# Patient Record
Sex: Male | Born: 1985 | Race: White | Hispanic: No | Marital: Single | Smoking: Never smoker
Health system: Southern US, Community
[De-identification: ages and names within clinical notes are randomized; demographics above are authoritative.]

## PROBLEM LIST (undated history)

## (undated) DIAGNOSIS — Q600 Renal agenesis, unilateral: Secondary | ICD-10-CM

## (undated) DIAGNOSIS — Z789 Other specified health status: Secondary | ICD-10-CM

## (undated) HISTORY — PX: HAND SURGERY: SHX662

---

## 2015-09-09 ENCOUNTER — Other Ambulatory Visit: Payer: Self-pay | Admitting: Nephrology

## 2015-09-09 DIAGNOSIS — L409 Psoriasis, unspecified: Secondary | ICD-10-CM

## 2015-09-09 DIAGNOSIS — R809 Proteinuria, unspecified: Secondary | ICD-10-CM

## 2015-09-09 DIAGNOSIS — N182 Chronic kidney disease, stage 2 (mild): Secondary | ICD-10-CM

## 2015-09-15 ENCOUNTER — Ambulatory Visit
Admission: RE | Admit: 2015-09-15 | Discharge: 2015-09-15 | Disposition: A | Discharge status: Home / Self Care | Payer: BLUE CROSS/BLUE SHIELD | Source: Ambulatory Visit | Attending: Nephrology | Admitting: Nephrology

## 2015-09-15 DIAGNOSIS — R809 Proteinuria, unspecified: Secondary | ICD-10-CM

## 2015-09-15 DIAGNOSIS — N182 Chronic kidney disease, stage 2 (mild): Secondary | ICD-10-CM

## 2015-09-15 DIAGNOSIS — L409 Psoriasis, unspecified: Secondary | ICD-10-CM

## 2015-10-03 ENCOUNTER — Other Ambulatory Visit (HOSPITAL_COMMUNITY): Payer: Self-pay | Admitting: Nephrology

## 2015-10-03 DIAGNOSIS — R809 Proteinuria, unspecified: Secondary | ICD-10-CM

## 2015-10-17 ENCOUNTER — Other Ambulatory Visit: Payer: Self-pay | Admitting: Radiology

## 2015-10-20 ENCOUNTER — Encounter (HOSPITAL_COMMUNITY): Payer: Self-pay

## 2015-10-20 ENCOUNTER — Ambulatory Visit (HOSPITAL_COMMUNITY)
Admission: RE | Admit: 2015-10-20 | Discharge: 2015-10-20 | Disposition: A | Discharge status: Home / Self Care | Payer: No Typology Code available for payment source | Source: Ambulatory Visit | Attending: Nephrology | Admitting: Nephrology

## 2015-10-20 DIAGNOSIS — N051 Unspecified nephritic syndrome with focal and segmental glomerular lesions: Secondary | ICD-10-CM | POA: Insufficient documentation

## 2015-10-20 DIAGNOSIS — R809 Proteinuria, unspecified: Secondary | ICD-10-CM | POA: Insufficient documentation

## 2015-10-20 DIAGNOSIS — N028 Recurrent and persistent hematuria with other morphologic changes: Secondary | ICD-10-CM | POA: Insufficient documentation

## 2015-10-20 HISTORY — DX: Other specified health status: Z78.9

## 2015-10-20 LAB — CBC
HCT: 43.7 % (ref 39.0–52.0)
HEMOGLOBIN: 14.9 g/dL (ref 13.0–17.0)
MCH: 29 pg (ref 26.0–34.0)
MCHC: 34.1 g/dL (ref 30.0–36.0)
MCV: 85.2 fL (ref 78.0–100.0)
PLATELETS: 194 10*3/uL (ref 150–400)
RBC: 5.13 MIL/uL (ref 4.22–5.81)
RDW: 12.8 % (ref 11.5–15.5)
WBC: 5.9 10*3/uL (ref 4.0–10.5)

## 2015-10-20 LAB — APTT: aPTT: 31 seconds (ref 24–36)

## 2015-10-20 LAB — PROTIME-INR
INR: 0.95
Prothrombin Time: 12.7 seconds (ref 11.4–15.2)

## 2015-10-20 MED ORDER — FENTANYL CITRATE (PF) 100 MCG/2ML IJ SOLN
INTRAMUSCULAR | Status: AC | PRN
  Administered 2015-10-20: 50 ug via INTRAVENOUS
  Administered 2015-10-20: 25 ug via INTRAVENOUS

## 2015-10-20 MED ORDER — MIDAZOLAM HCL 2 MG/2ML IJ SOLN
INTRAMUSCULAR | Status: AC | PRN
  Administered 2015-10-20: 1 mg via INTRAVENOUS
  Administered 2015-10-20: 0.5 mg via INTRAVENOUS

## 2015-10-20 MED ORDER — MIDAZOLAM HCL 2 MG/2ML IJ SOLN
INTRAMUSCULAR | Status: AC
Start: 2015-10-20 — End: 2015-10-20
  Filled 2015-10-20: qty 2

## 2015-10-20 MED ORDER — HYDRALAZINE HCL 20 MG/ML IJ SOLN: 10.0000 mg | Freq: Once | INTRAMUSCULAR | Status: DC

## 2015-10-20 MED ORDER — SODIUM CHLORIDE 0.9 % IV SOLN
INTRAVENOUS | Status: AC | PRN
  Administered 2015-10-20: 10 mL/h via INTRAVENOUS

## 2015-10-20 MED ORDER — SODIUM CHLORIDE 0.9 % IV SOLN
INTRAVENOUS | Status: DC
Start: 2015-10-20 — End: 2015-10-21

## 2015-10-20 MED ORDER — FENTANYL CITRATE (PF) 100 MCG/2ML IJ SOLN
INTRAMUSCULAR | Status: AC
  Filled 2015-10-20: qty 2

## 2015-10-20 MED ORDER — LIDOCAINE HCL 1 % IJ SOLN
INTRAMUSCULAR | Status: AC
  Filled 2015-10-20: qty 20

## 2015-10-20 NOTE — Discharge Instructions (Signed)
Kidney Biopsy, Care After °Refer to this sheet in the next few weeks. These instructions provide you with information on caring for yourself after your procedure. Your health care provider may also give you more specific instructions. Your treatment has been planned according to current medical practices, but problems sometimes occur. Call your health care provider if you have any problems or questions after your procedure.  °WHAT TO EXPECT AFTER THE PROCEDURE  °· You may notice blood in the urine for the first 24 hours after the biopsy. °· You may feel some pain at the biopsy site for 1-2 weeks after the biopsy. °HOME CARE INSTRUCTIONS °· Do not lift anything heavier than 10 lb (4.5 kg) for 2 weeks. °· Do not take any non-steroidal anti-inflammatory drugs (NSAIDs) or any blood thinners for a week after the biopsy unless instructed to do so by your health care provider. °· Only take medicines for pain, fever, or discomfort as directed by your health care provider. °SEEK MEDICAL CARE IF: °· You have bloody urine more than 24 hours after the biopsy.   °· You develop a fever.   °· You cannot urinate.   °· You have increasing pain at the biopsy site.   °SEEK IMMEDIATE MEDICAL CARE IF: °You feel faint or dizzy.  °  °This information is not intended to replace advice given to you by your health care provider. Make sure you discuss any questions you have with your health care provider. °  °Document Released: 09/20/2012 Document Reviewed: 09/20/2012 °Elsevier Interactive Patient Education ©2016 Elsevier Inc. ° °

## 2015-10-20 NOTE — H&P (Signed)
Chief Complaint: proteinuria  Referring Physician:Dr. Zetta Bills  Supervising Physician: Irish Lack  Patient Status:  Out-pt  HPI: Bradley Shannon is an 30 y.o. male with a history of psoriasis for which he receives an injection one every 3 months.  During his routine lab work, he was noted to have an elevated creatinine.  Upon further work up, he was noted to have protein in his urine.  He was referred to a nephrologist who has placed him on a protein restricted and caffeine restricted diet.  A request for a random renal biopsy has been made.  He presents today for this procedure.  Past Medical History:  Past Medical History:  Diagnosis Date  . Medical history non-contributory     Past Surgical History:  Past Surgical History:  Procedure Laterality Date  . HAND SURGERY      Family History:  Family History  Problem Relation Age of Onset  . Breast cancer Mother     Social History:  reports that he has never smoked. He has never used smokeless tobacco. He reports that he drinks alcohol. He reports that he does not use drugs.  Allergies: No Known Allergies  Medications: Medications reviewed in Epic  Please HPI for pertinent positives, otherwise complete 10 system ROS negative.  Mallampati Score: MD Evaluation Airway: WNL Heart: WNL Abdomen: WNL Chest/ Lungs: WNL ASA  Classification: 1 Mallampati/Airway Score: Two  Physical Exam: BP (!) 141/88   Pulse (!) 59   Temp 98.1 F (36.7 C) (Oral)   Resp 18   Ht 5\' 11"  (1.803 m)   Wt 170 lb (77.1 kg)   SpO2 100%   BMI 23.71 kg/m  Body mass index is 23.71 kg/m. General: pleasant, WD, WN white male who is laying in bed in NAD HEENT: head is normocephalic, atraumatic.  Sclera are noninjected.  PERRL.  Ears and nose without any masses or lesions.  Mouth is pink and moist Heart: regular, rate, and rhythm.  Normal s1,s2. No obvious murmurs, gallops, or rubs noted.  Palpable radial and pedal pulses  bilaterally Lungs: CTAB, no wheezes, rhonchi, or rales noted.  Respiratory effort nonlabored Abd: soft, NT, ND, +BS, no masses, hernias, or organomegaly Psych: A&Ox3 with an appropriate affect.   Labs: Results for orders placed or performed during the hospital encounter of 10/20/15 (from the past 48 hour(s))  APTT upon arrival     Status: None   Collection Time: 10/20/15  6:06 AM  Result Value Ref Range   aPTT 31 24 - 36 seconds  CBC upon arrival     Status: None   Collection Time: 10/20/15  6:06 AM  Result Value Ref Range   WBC 5.9 4.0 - 10.5 K/uL   RBC 5.13 4.22 - 5.81 MIL/uL   Hemoglobin 14.9 13.0 - 17.0 g/dL   HCT 21.3 08.6 - 57.8 %   MCV 85.2 78.0 - 100.0 fL   MCH 29.0 26.0 - 34.0 pg   MCHC 34.1 30.0 - 36.0 g/dL   RDW 46.9 62.9 - 52.8 %   Platelets 194 150 - 400 K/uL  Protime-INR upon arrival     Status: None   Collection Time: 10/20/15  6:06 AM  Result Value Ref Range   Prothrombin Time 12.7 11.4 - 15.2 seconds   INR 0.95     Imaging: No results found.  Assessment/Plan 1. Proteinuria -we will plan to proceed today with a random renal biopsy -his latest BP was 137/76 which is an improvement from  arrival which was likely elevated due to nerves. -other labs and vitals have been reviewed -Risks and Benefits discussed with the patient including, but not limited to bleeding, infection, damage to adjacent structures or low yield requiring additional tests. All of the patient's questions were answered, patient is agreeable to proceed. Consent signed and in chart.  Thank you for this interesting consult.  I greatly enjoyed meeting Bradley Shannon and look forward to participating in their care.  A copy of this report was sent to the requesting provider on this date.  Electronically Signed: Letha CapeSBORNE,Lilian Fuhs E 10/20/2015, 8:16 AM   I spent a total of  30 Minutes   in face to face in clinical consultation, greater than 50% of which was counseling/coordinating care for  proteinuria

## 2015-10-20 NOTE — Procedures (Signed)
Interventional Radiology Procedure Note  Procedure:  US guided core biopsy of right kidney  Complications: None   Estimated Blood Loss: < 10 mL  16 G core samples x 2 of right lower pole renal cortex.  Plan:  Bedrest/recovery x 3 hours  Bradley Shannon, M.D Pager:  [phone]

## 2015-10-27 ENCOUNTER — Encounter (HOSPITAL_COMMUNITY): Payer: Self-pay

## 2018-01-28 IMAGING — US US BIOPSY
1 series · 12 of 12 positions shown · non-contrast
Comparison: none

CLINICAL DATA: Proteinuria and need for renal biopsy.

[Series 1: us biopsy · 0.20mm/px · 12 of 12 slices shown]
[im 1/12]
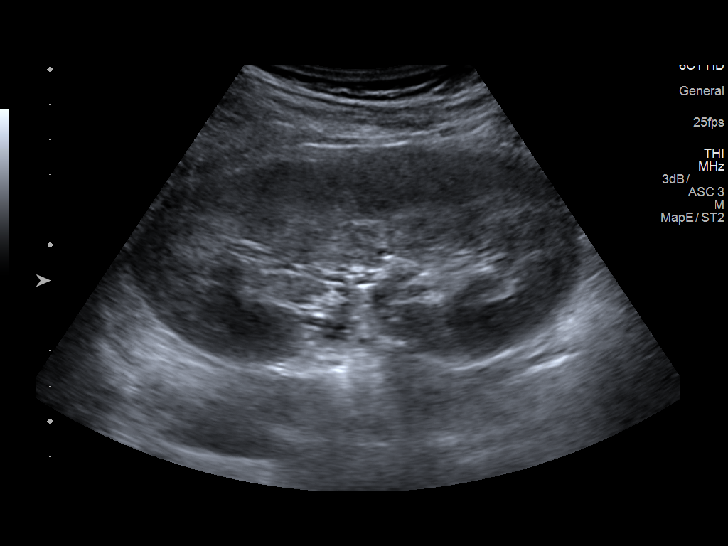
[im 2/12]
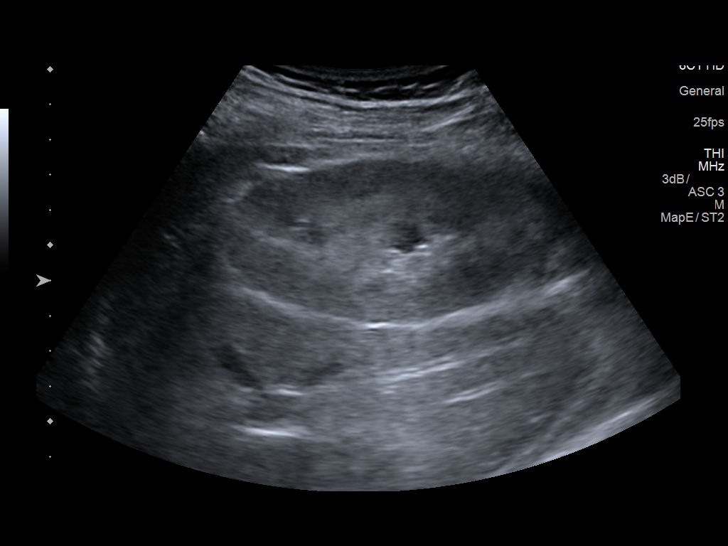
[im 3/12]
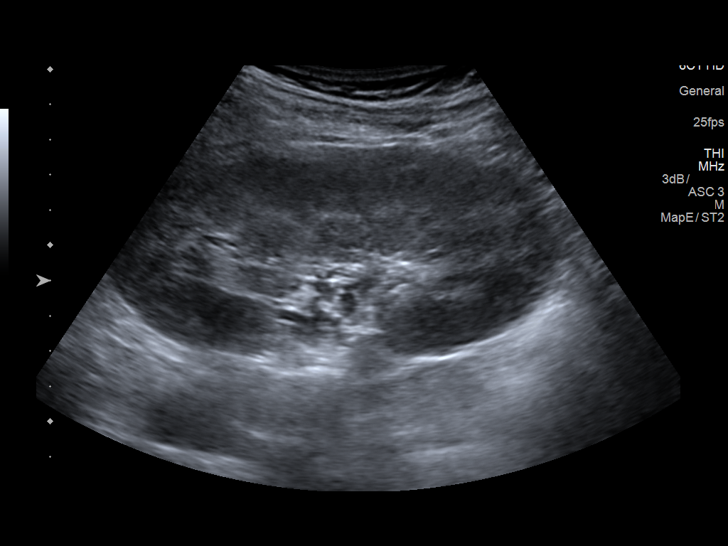
[im 4/12]
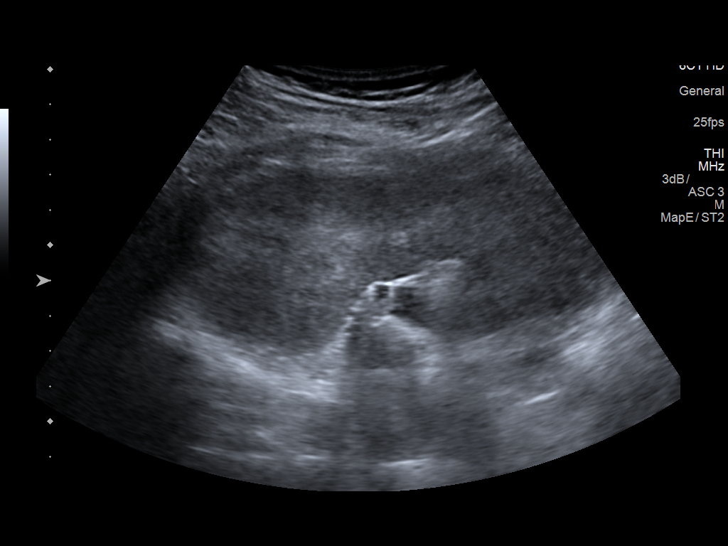
[im 5/12]
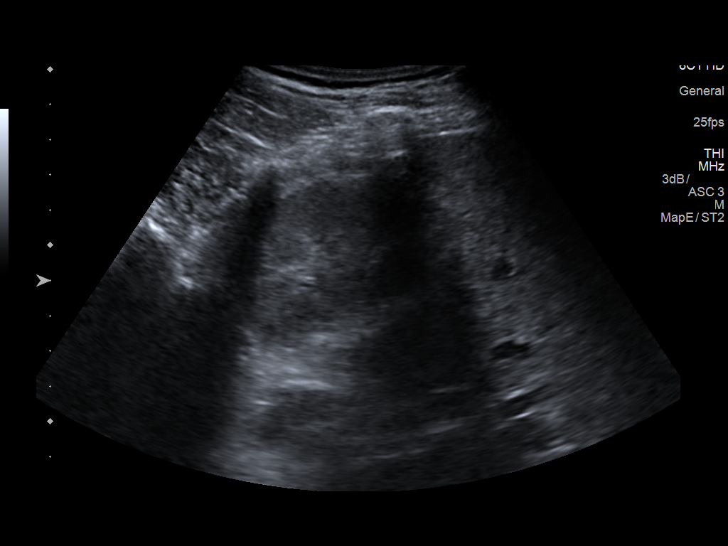
[im 6/12]
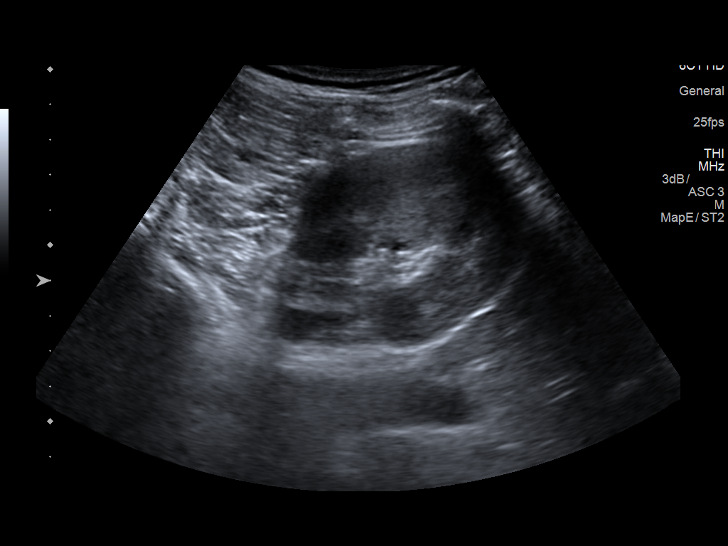
[im 7/12]
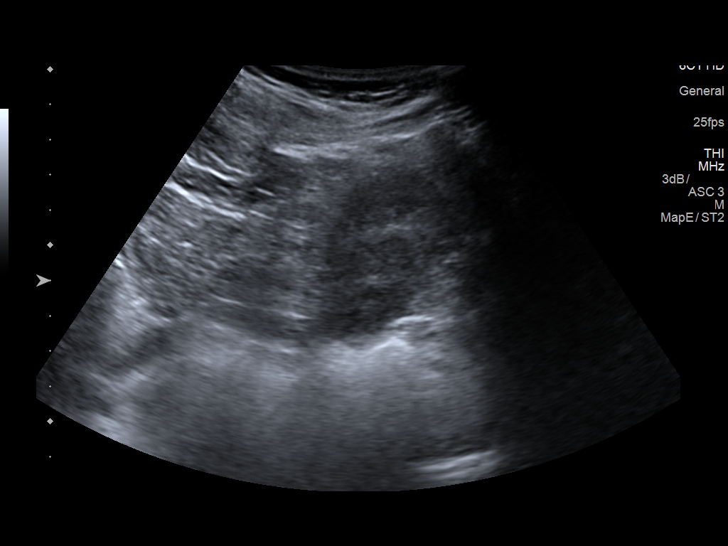
[im 8/12]
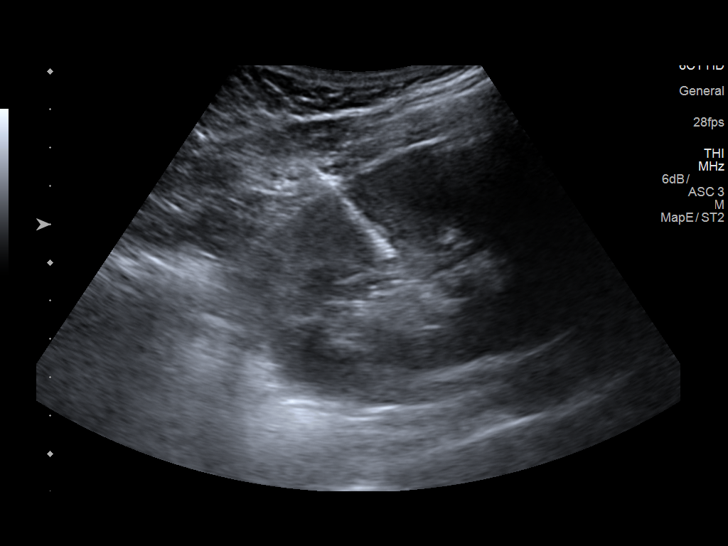
[im 9/12]
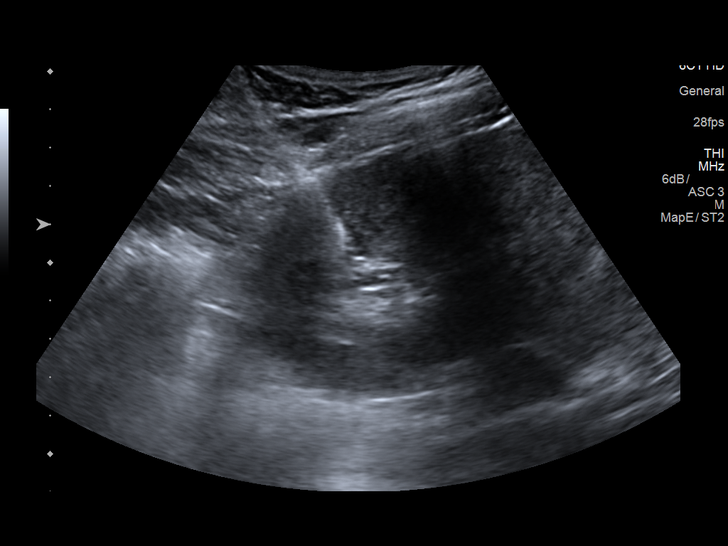
[im 10/12]
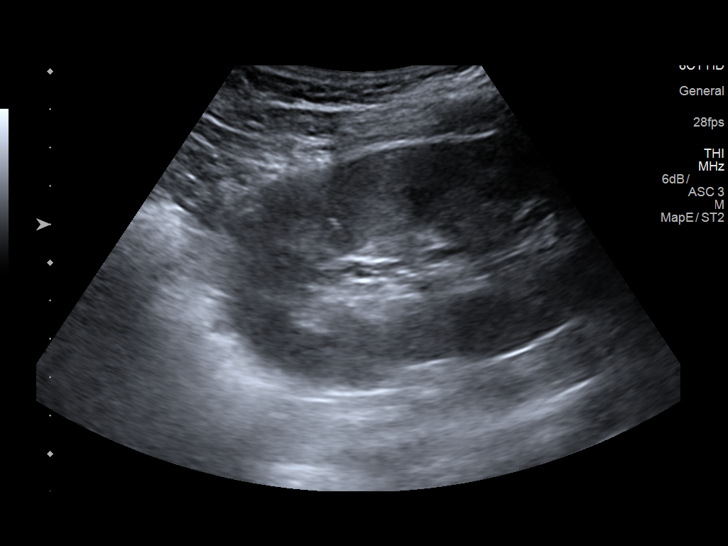
[im 11/12]
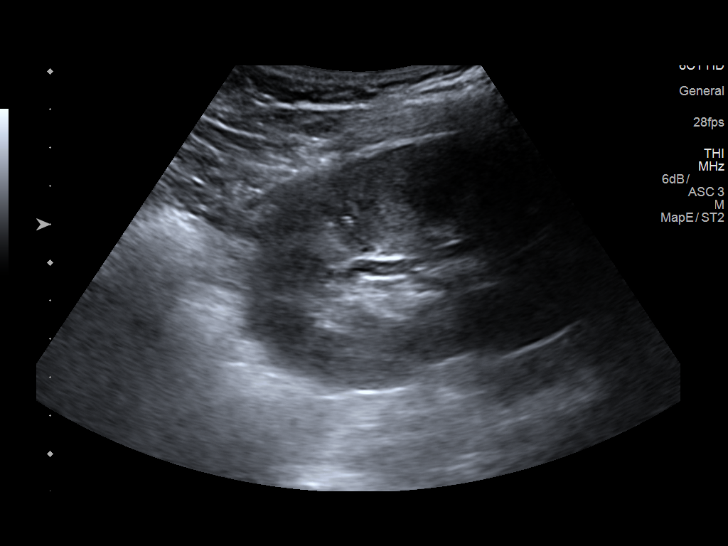
[im 12/12]
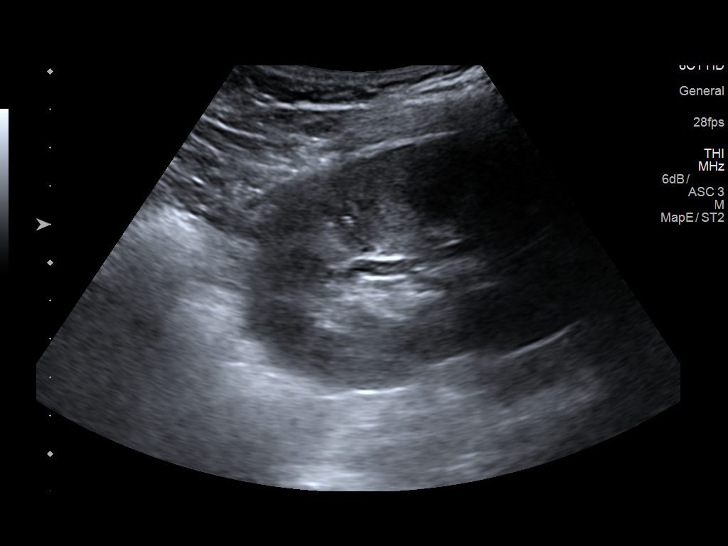

[12 of 12 positions shown; findings below may reference images not displayed]

EXAM:
ULTRASOUND GUIDED CORE BIOPSY OF RIGHT KIDNEY

MEDICATIONS:
1.5 mg IV Versed; 75 mcg IV Fentanyl

Total Moderate Sedation Time: 11 minutes.

The patient's level of consciousness and physiologic status were
continuously monitored during the procedure by Radiology nursing.

PROCEDURE:
The procedure, risks, benefits, and alternatives were explained to
the patient. Questions regarding the procedure were encouraged and
answered. The patient understands and consents to the procedure. A
time out was performed prior to initiating the procedure.

The right flank region was prepped with chlorhexidine in a sterile
fashion, and a sterile drape was applied covering the operative
field. A sterile gown and sterile gloves were used for the
procedure. Local anesthesia was provided with 1% Lidocaine.

Two separate 16 gauge core biopsy samples were obtained within lower
pole cortex of the right kidney. Core samples were submitted in
saline.

COMPLICATIONS:
None.
FINDINGS: The right kidney was well visualized by ultrasound. There again is
nonvisualization of a left kidney. Intact core biopsy samples were
obtained.
IMPRESSION: Ultrasound-guided core biopsy performed of lower pole cortex of the
right kidney. The left kidney is again nonvisualized by ultrasound.

## 2018-03-03 IMAGING — US US RENAL
1 series · 14 of 25 positions shown · non-contrast
Comparison: None.

CLINICAL DATA: Proteinuria, chronic renal insufficiency stage III;
history of psoriasis. Baseline study.

EXAM:
RENAL / URINARY TRACT ULTRASOUND COMPLETE

[Series 1: us renal · 0.28mm/px · 14 of 37 slices shown]
[im 1/37]
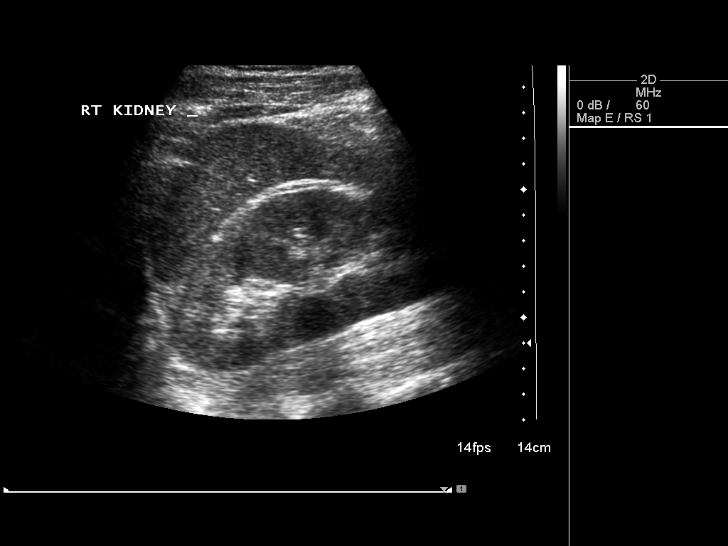
[im 4/37]
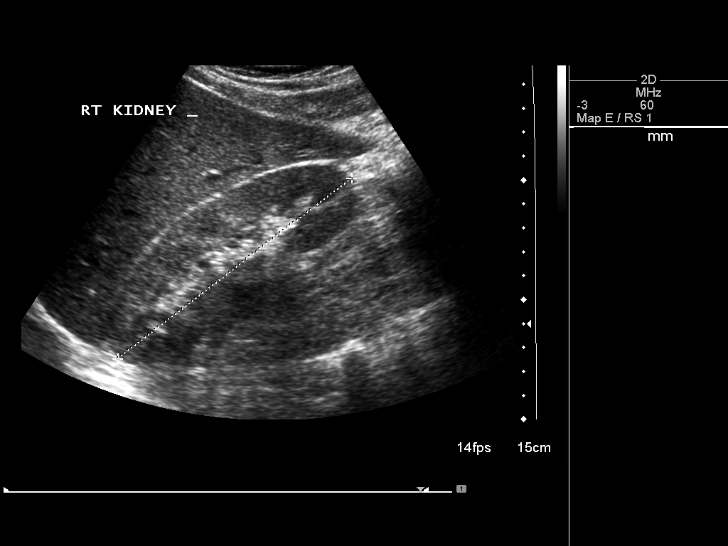
[im 7/37]
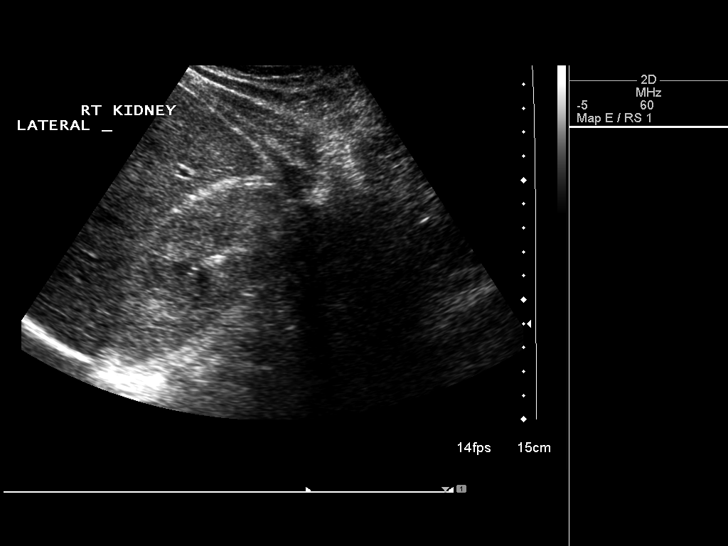
[im 10/37]
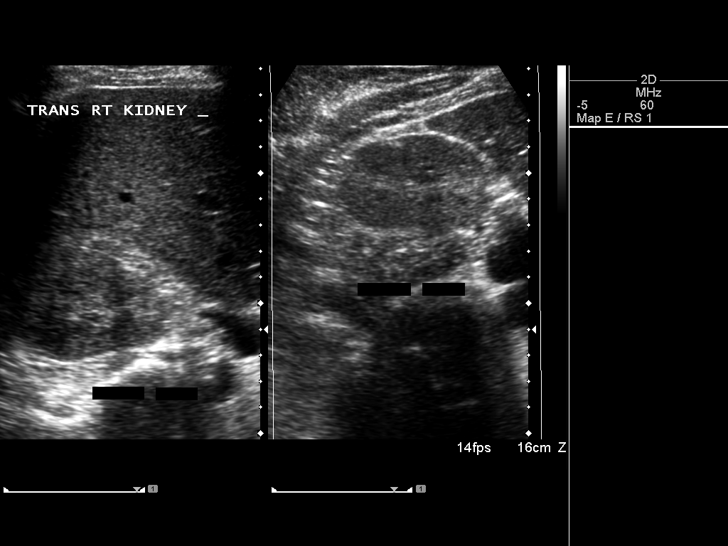
[im 13/37]
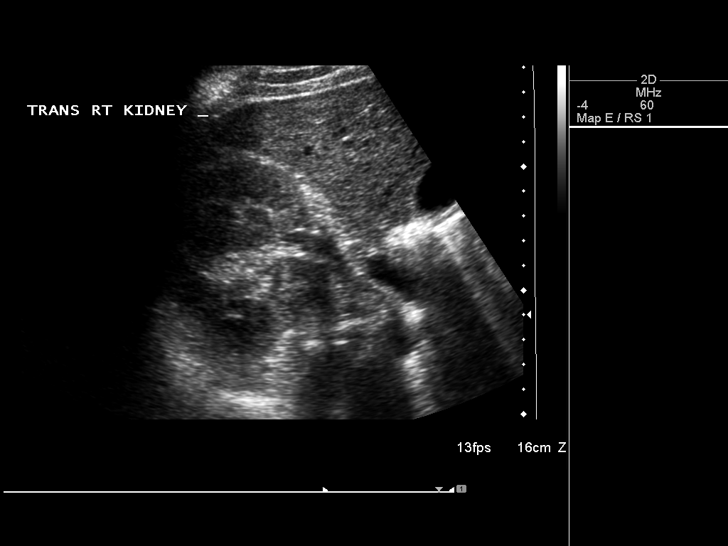
[im 14/37]
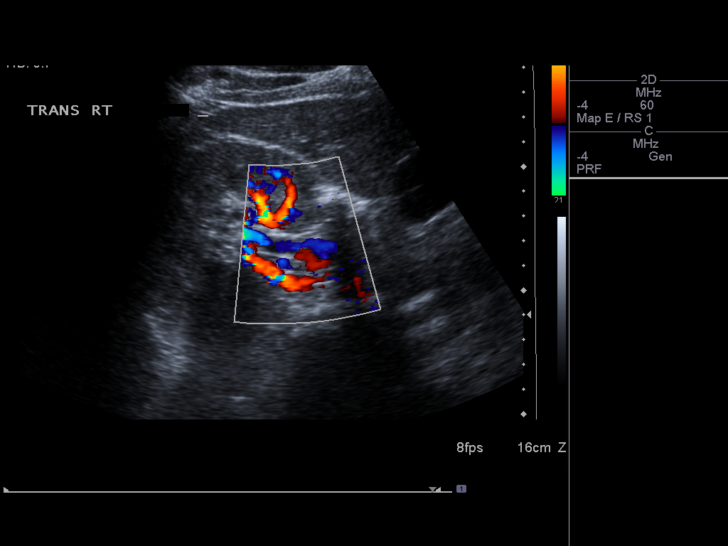
[im 17/37]
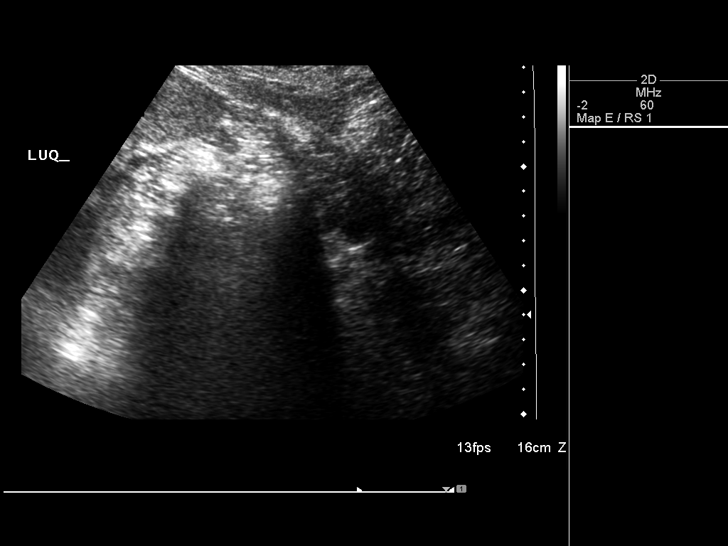
[im 20/37]
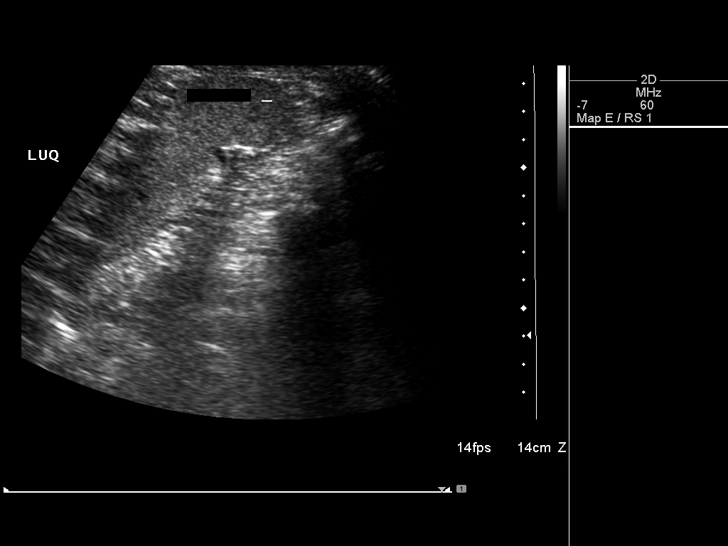
[im 23/37]
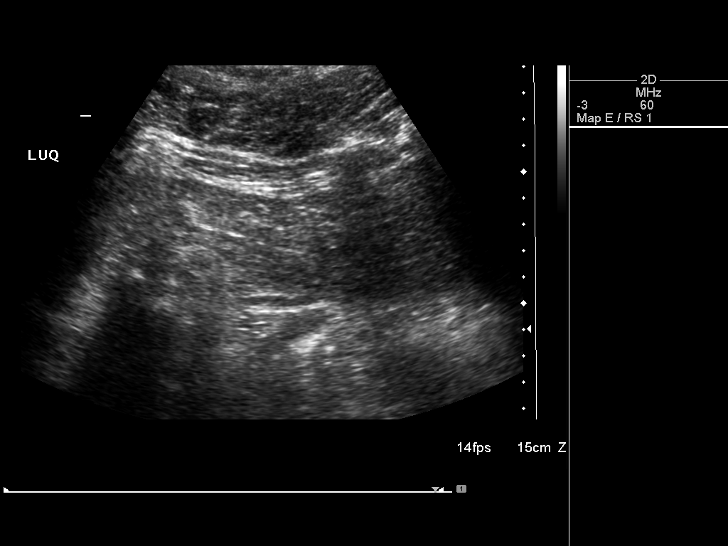
[im 25/37]
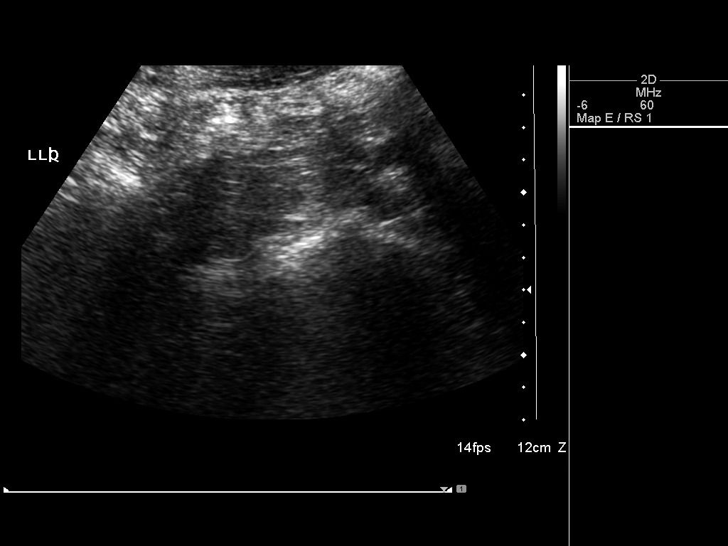
[im 28/37]
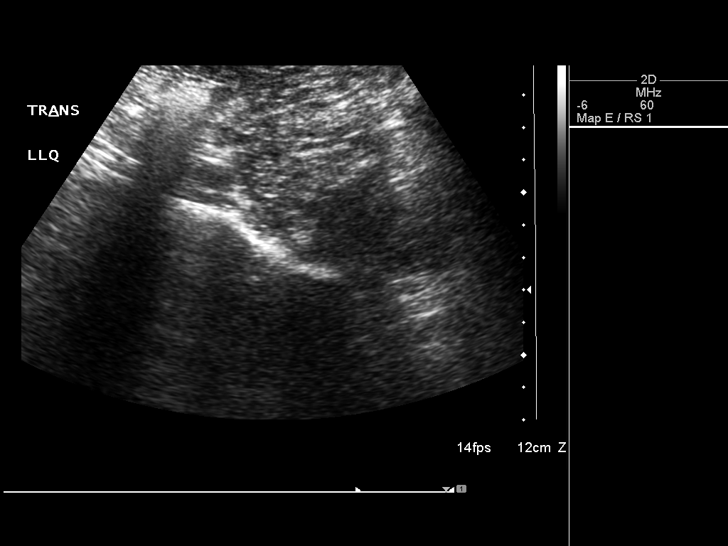
[im 31/37]
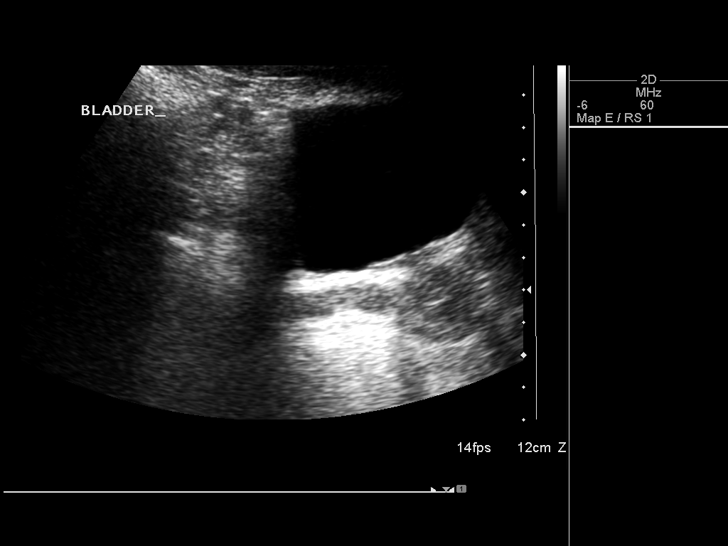
[im 34/37]
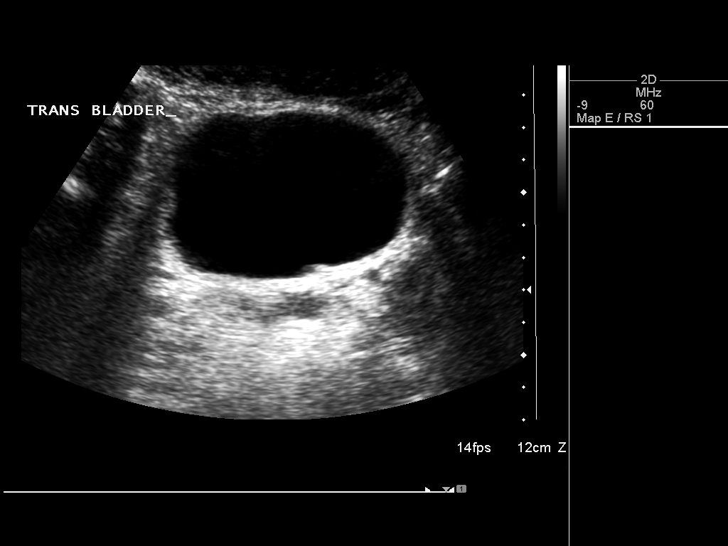
[im 37/37]
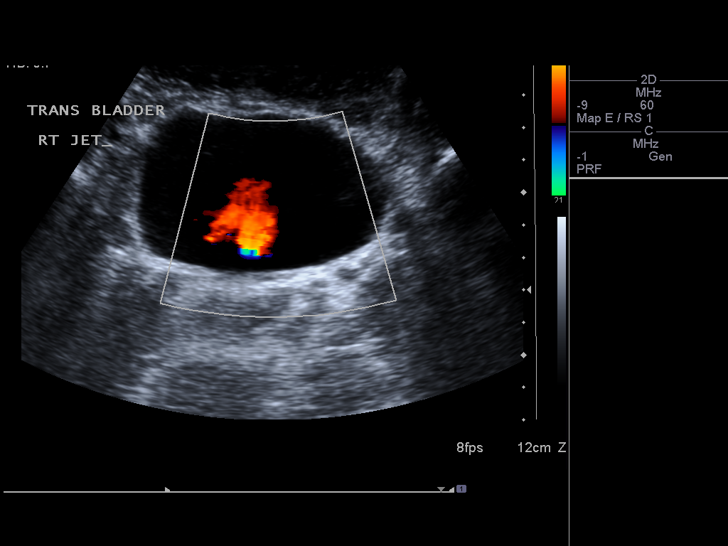

[14 of 25 positions shown; findings below may reference images not displayed]

FINDINGS: Right Kidney:

Length: 12.4 cm. The renal cortical echotexture is equal to or
slightly greater than that of the adjacent liver. There is no
hydronephrosis. No stones or masses are observed.

Left Kidney:

Length: The left kidney was not visualized in the left renal fossa
or in the pelvis..

Bladder:

Appears normal for degree of bladder distention. A normal right
ureteral jet was observed. No left ureteral jet was demonstrated.
IMPRESSION: Nonvisualization of the left kidney.

Mildly increased echotexture of the right kidney. No hydronephrosis.

Normal urinary bladder.

## 2018-06-12 ENCOUNTER — Other Ambulatory Visit (HOSPITAL_COMMUNITY): Payer: Self-pay | Admitting: Nephrology

## 2018-06-12 DIAGNOSIS — R809 Proteinuria, unspecified: Secondary | ICD-10-CM

## 2018-06-12 DIAGNOSIS — N028 Recurrent and persistent hematuria with other morphologic changes: Secondary | ICD-10-CM

## 2018-06-20 ENCOUNTER — Other Ambulatory Visit: Payer: Self-pay | Admitting: Radiology

## 2018-06-21 ENCOUNTER — Encounter (HOSPITAL_COMMUNITY): Payer: Self-pay

## 2018-06-21 ENCOUNTER — Other Ambulatory Visit: Payer: Self-pay

## 2018-06-21 ENCOUNTER — Ambulatory Visit (HOSPITAL_COMMUNITY)
Admission: RE | Admit: 2018-06-21 | Discharge: 2018-06-21 | Disposition: A | Discharge status: Home / Self Care | Payer: Managed Care, Other (non HMO) | Source: Ambulatory Visit | Attending: Nephrology | Admitting: Nephrology

## 2018-06-21 DIAGNOSIS — N028 Recurrent and persistent hematuria with other morphologic changes: Secondary | ICD-10-CM | POA: Diagnosis present

## 2018-06-21 DIAGNOSIS — N269 Renal sclerosis, unspecified: Secondary | ICD-10-CM | POA: Insufficient documentation

## 2018-06-21 DIAGNOSIS — N020 Recurrent and persistent hematuria with minor glomerular abnormality: Secondary | ICD-10-CM | POA: Insufficient documentation

## 2018-06-21 DIAGNOSIS — Z79899 Other long term (current) drug therapy: Secondary | ICD-10-CM | POA: Insufficient documentation

## 2018-06-21 DIAGNOSIS — R809 Proteinuria, unspecified: Secondary | ICD-10-CM | POA: Diagnosis not present

## 2018-06-21 DIAGNOSIS — Q600 Renal agenesis, unilateral: Secondary | ICD-10-CM | POA: Insufficient documentation

## 2018-06-21 HISTORY — DX: Renal agenesis, unilateral: Q60.0

## 2018-06-21 LAB — CBC
HCT: 40.9 % (ref 39.0–52.0)
Hemoglobin: 13.8 g/dL (ref 13.0–17.0)
MCH: 29.1 pg (ref 26.0–34.0)
MCHC: 33.7 g/dL (ref 30.0–36.0)
MCV: 86.1 fL (ref 80.0–100.0)
Platelets: 254 10*3/uL (ref 150–400)
RBC: 4.75 MIL/uL (ref 4.22–5.81)
RDW: 12.3 % (ref 11.5–15.5)
WBC: 7.2 10*3/uL (ref 4.0–10.5)
nRBC: 0 % (ref 0.0–0.2)

## 2018-06-21 LAB — PROTIME-INR
INR: 1 (ref 0.8–1.2)
Prothrombin Time: 12.7 seconds (ref 11.4–15.2)

## 2018-06-21 LAB — APTT: aPTT: 33 seconds (ref 24–36)

## 2018-06-21 MED ORDER — SODIUM CHLORIDE 0.9 % IV SOLN: INTRAVENOUS | Status: DC

## 2018-06-21 MED ORDER — HYDROCODONE-ACETAMINOPHEN 5-325 MG PO TABS: 1.0000 | ORAL_TABLET | ORAL | Status: DC | PRN

## 2018-06-21 MED ORDER — FENTANYL CITRATE (PF) 100 MCG/2ML IJ SOLN
INTRAMUSCULAR | Status: AC | PRN
  Administered 2018-06-21: 25 ug via INTRAVENOUS
  Administered 2018-06-21: 50 ug via INTRAVENOUS
  Administered 2018-06-21: 25 ug via INTRAVENOUS

## 2018-06-21 MED ORDER — FENTANYL CITRATE (PF) 100 MCG/2ML IJ SOLN
INTRAMUSCULAR | Status: AC
  Filled 2018-06-21: qty 2

## 2018-06-21 MED ORDER — MIDAZOLAM HCL 2 MG/2ML IJ SOLN
INTRAMUSCULAR | Status: AC | PRN
  Administered 2018-06-21 (×2): 1 mg via INTRAVENOUS

## 2018-06-21 MED ORDER — LIDOCAINE HCL (PF) 1 % IJ SOLN
INTRAMUSCULAR | Status: AC
  Filled 2018-06-21: qty 30

## 2018-06-21 MED ORDER — MIDAZOLAM HCL 2 MG/2ML IJ SOLN
INTRAMUSCULAR | Status: AC
  Filled 2018-06-21: qty 2

## 2018-06-21 NOTE — Discharge Instructions (Addendum)
Percutaneous Kidney Biopsy, Care After °This sheet gives you information about how to care for yourself after your procedure. Your health care provider may also give you more specific instructions. If you have problems or questions, contact your health care provider. °What can I expect after the procedure? °After the procedure, it is common to have: °· Pain or soreness near the area where the needle went through your skin (biopsy site). °· Bright pink or cloudy urine for 24 hours after the procedure. °Follow these instructions at home: °Activity °· Return to your normal activities as told by your health care provider. Ask your health care provider what activities are safe for you. °· Do not drive for 24 hours if you were given a medicine to help you relax (sedative). °· Do not lift anything that is heavier than 10 lb (4.5 kg) until your health care provider tells you that it is safe. °· Avoid activities that take a lot of effort (are strenuous) until your health care provider approves. Most people will have to wait 2 weeks before returning to activities such as exercise or sexual intercourse. °General instructions ° °· Take over-the-counter and prescription medicines only as told by your health care provider. °· You may eat and drink after your procedure. Follow instructions from your health care provider about eating or drinking restrictions. °· Check your biopsy site every day for signs of infection. Check for: °? More redness, swelling, or pain. °? More fluid or blood. °? Warmth. °? Pus or a bad smell. °· Keep all follow-up visits as told by your health care provider. This is important. °Contact a health care provider if: °· You have more redness, swelling, or pain around your biopsy site. °· You have more fluid or blood coming from your biopsy site. °· Your biopsy site feels warm to the touch. °· You have pus or a bad smell coming from your biopsy site. °· You have blood in your urine more than 24 hours after  your procedure. °Get help right away if: °· You have dark red or brown urine. °· You have a fever. °· You are unable to urinate. °· You feel burning when you urinate. °· You feel faint. °· You have severe pain in your abdomen or side. °This information is not intended to replace advice given to you by your health care provider. Make sure you discuss any questions you have with your health care provider. °Document Released: 09/20/2012 Document Revised: 10/31/2015 Document Reviewed: 10/31/2015 °Elsevier Interactive Patient Education © 2019 [employer]vier Inc. °Moderate Conscious Sedation, Adult, Care After °These instructions provide you with information about caring for yourself after your procedure. Your health care provider may also give you more specific instructions. Your treatment has been planned according to current medical practices, but problems sometimes occur. Call your health care provider if you have any problems or questions after your procedure. °What can I expect after the procedure? °After your procedure, it is common: °· To feel sleepy for several hours. °· To feel clumsy and have poor balance for several hours. °· To have poor judgment for several hours. °· To vomit if you eat too soon. °Follow these instructions at home: °For at least 24 hours after the procedure: ° °· Do not: °? Participate in activities where you could fall or become injured. °? Drive. °? Use heavy machinery. °? Drink alcohol. °? Take sleeping pills or medicines that cause drowsiness. °? Make important decisions or sign legal documents. °? Take care of children on   your own. °· Rest. °Eating and drinking °· Follow the diet recommended by your health care provider. °· If you vomit: °? Drink water, juice, or soup when you can drink without vomiting. °? Make sure you have little or no nausea before eating solid foods. °General instructions °· Have a responsible adult stay with you until you are awake and alert. °· Take over-the-counter  and prescription medicines only as told by your health care provider. °· If you smoke, do not smoke without supervision. °· Keep all follow-up visits as told by your health care provider. This is important. °Contact a health care provider if: °· You keep feeling nauseous or you keep vomiting. °· You feel light-headed. °· You develop a rash. °· You have a fever. °Get help right away if: °· You have trouble breathing. °This information is not intended to replace advice given to you by your health care provider. Make sure you discuss any questions you have with your health care provider. °Document Released: 11/08/2012 Document Revised: 06/23/2015 Document Reviewed: 05/10/2015 °Elsevier Interactive Patient Education © 2019 Elsevier Inc. ° °

## 2018-06-21 NOTE — H&P (Addendum)
Chief Complaint: Patient was seen in consultation today for IgA nephropathy/proteinuria/random renal biopsy.  Referring Physician(s): Patel,Jay  Supervising Physician: Richarda Overlie  Patient Status: [facility] - Out-pt  History of Present Illness: Bradley Shannon is a 33 y.o. male with a past medical history of congenital absence of one kidney and psoriasis. He is known to IR and underwent an image-guided random renal biopsy 10/20/2015 by Dr. Fredia Sorrow due to patient's proteinuria. He was unfortunately lost to follow-up and was recently referred back to nephrology for further management of IgA nephropathy/proteinuria.  IR requested by Dr. Allena Katz for possible image-guided random renal biopsy. Patient awake and alert laying in bed with no complaints at this time. Denies fever, chills, chest pain, dyspnea, abdominal pain, or headache.   Past Medical History:  Diagnosis Date  . Congenital absence of one kidney   . Medical history non-contributory     Past Surgical History:  Procedure Laterality Date  . HAND SURGERY      Allergies: Patient has no known allergies.  Medications: Prior to Admission medications   Medication Sig Start Date End Date Taking? Authorizing Provider  lisinopril (ZESTRIL) 10 MG tablet Take 10 mg by mouth daily.    Yes [provider]  Multiple Vitamin (MULTIVITAMIN) capsule Take 1 capsule by mouth daily.   Yes [provider]  Omega-3 Fatty Acids (FISH OIL) 1000 MG CAPS Take 2,000 mg by mouth daily.   Yes [provider]  Probiotic Product (PROBIOTIC DAILY) CAPS Take 1 capsule by mouth daily.   Yes [provider]     Family History  Problem Relation Age of Onset  . Breast cancer Mother     Social History   Socioeconomic History  . Marital status: Single    Spouse name: Not on file  . Number of children: Not on file  . Years of education: Not on file  . Highest education level: Not on file  Occupational History   . Not on file  Social Needs  . Financial resource strain: Not on file  . Food insecurity:    Worry: Not on file    Inability: Not on file  . Transportation needs:    Medical: Not on file    Non-medical: Not on file  Tobacco Use  . Smoking status: Never Smoker  . Smokeless tobacco: Never Used  Substance and Sexual Activity  . Alcohol use: Yes    Comment: social drinker  . Drug use: No  . Sexual activity: Not on file  Lifestyle  . Physical activity:    Days per week: Not on file    Minutes per session: Not on file  . Stress: Not on file  Relationships  . Social connections:    Talks on phone: Not on file    Gets together: Not on file    Attends religious service: Not on file    Active member of club or organization: Not on file    Attends meetings of clubs or organizations: Not on file    Relationship status: Not on file  Other Topics Concern  . Not on file  Social History Narrative  . Not on file     Review of Systems: A 12 point ROS discussed and pertinent positives are indicated in the HPI above.  All other systems are negative.  Review of Systems  Constitutional: Negative for chills and fever.  Respiratory: Negative for shortness of breath and wheezing.   Cardiovascular: Negative for chest pain and palpitations.  Gastrointestinal: Negative for abdominal pain.  Neurological: Negative for headaches.  Psychiatric/Behavioral: Negative for behavioral problems and confusion.    Vital Signs: BP (!) 148/86   Pulse 66   Temp 98 F (36.7 C) (Oral)   Resp 16   Ht 5\' 11"  (1.803 m)   Wt 175 lb (79.4 kg)   SpO2 99%   BMI 24.41 kg/m   Physical Exam Vitals signs and nursing note reviewed.  Constitutional:      General: He is not in acute distress.    Appearance: Normal appearance.  Cardiovascular:     Rate and Rhythm: Normal rate and regular rhythm.     Heart sounds: Normal heart sounds. No murmur.  Pulmonary:     Effort: Pulmonary effort is normal. No  respiratory distress.     Breath sounds: Normal breath sounds. No wheezing.  Skin:    General: Skin is warm and dry.  Neurological:     Mental Status: He is alert and oriented to person, place, and time.  Psychiatric:        Mood and Affect: Mood normal.        Behavior: Behavior normal.        Thought Content: Thought content normal.        Judgment: Judgment normal.      MD Evaluation Airway: WNL Heart: WNL Abdomen: WNL Chest/ Lungs: WNL ASA  Classification: 2 Mallampati/Airway Score: Two   Imaging: No results found.  Labs:  CBC: Recent Labs    06/21/18 0630  WBC 7.2  HGB 13.8  HCT 40.9  PLT 254    COAGS: Recent Labs    06/21/18 0630  INR 1.0  APTT 33     Assessment and Plan:  IgA nephropathy/proteinuria. Plan for image-guided random renal biopsy today with Dr. Lowella DandyHenn. Patient is NPO. Afebrile and WBCs WNL. He does not take blood thinners. INR 1.0 today.  Risks and benefits discussed with the patient including, but not limited to bleeding, infection, damage to adjacent structures or low yield requiring additional tests. All of the patient's questions were answered, patient is agreeable to proceed. Consent signed and in chart.   Thank you for this interesting consult.  I greatly enjoyed meeting Jacklynn LewisChristopher T Tubby and look forward to participating in their care.  A copy of this report was sent to the requesting provider on this date.  Electronically Signed: Elwin MochaAlexandra Louk, PA-C 06/21/2018, 7:48 AM   I spent a total of 40 Minutes in face to face in clinical consultation, greater than 50% of which was counseling/coordinating care for IgA nephropathy/proteinuria/random renal biopsy.

## 2018-06-21 NOTE — Progress Notes (Signed)
D/c instructions reviewed with father Trey Paula, via telephone, due to COVID19 restrictions.  All questions answered and Trey Paula verbalized understanding

## 2018-06-21 NOTE — Procedures (Signed)
Interventional Radiology Procedure:   Indications: IgA nephropathy, proteinuria  Procedure: US guided random renal biopsy  Findings: 2 cores from right kidney lower pole  Complications: None     EBL: Less than 10 ml  Plan: Bedrest 4 hours.     Kameran Mcneese R. Lowella Dandy, MD  Pager: [phone]

## 2018-07-07 ENCOUNTER — Encounter (HOSPITAL_COMMUNITY): Payer: Self-pay

## 2019-02-12 DIAGNOSIS — Z20822 Contact with and (suspected) exposure to covid-19: Secondary | ICD-10-CM | POA: Diagnosis not present

## 2019-02-12 DIAGNOSIS — J029 Acute pharyngitis, unspecified: Secondary | ICD-10-CM | POA: Diagnosis not present

## 2019-02-12 DIAGNOSIS — R6889 Other general symptoms and signs: Secondary | ICD-10-CM | POA: Diagnosis not present

## 2019-03-26 DIAGNOSIS — N529 Male erectile dysfunction, unspecified: Secondary | ICD-10-CM | POA: Diagnosis not present

## 2019-07-03 DIAGNOSIS — K121 Other forms of stomatitis: Secondary | ICD-10-CM | POA: Diagnosis not present

## 2019-07-03 DIAGNOSIS — F419 Anxiety disorder, unspecified: Secondary | ICD-10-CM | POA: Diagnosis not present

## 2019-08-31 DIAGNOSIS — J029 Acute pharyngitis, unspecified: Secondary | ICD-10-CM | POA: Diagnosis not present

## 2019-08-31 DIAGNOSIS — J039 Acute tonsillitis, unspecified: Secondary | ICD-10-CM | POA: Diagnosis not present

## 2019-08-31 DIAGNOSIS — R6889 Other general symptoms and signs: Secondary | ICD-10-CM | POA: Diagnosis not present

## 2019-08-31 DIAGNOSIS — Z20822 Contact with and (suspected) exposure to covid-19: Secondary | ICD-10-CM | POA: Diagnosis not present

## 2019-09-10 DIAGNOSIS — N028 Recurrent and persistent hematuria with other morphologic changes: Secondary | ICD-10-CM | POA: Diagnosis not present

## 2019-09-10 DIAGNOSIS — N183 Chronic kidney disease, stage 3 unspecified: Secondary | ICD-10-CM | POA: Diagnosis not present

## 2019-09-10 DIAGNOSIS — E559 Vitamin D deficiency, unspecified: Secondary | ICD-10-CM | POA: Diagnosis not present

## 2019-09-14 DIAGNOSIS — E875 Hyperkalemia: Secondary | ICD-10-CM | POA: Diagnosis not present

## 2020-12-07 IMAGING — US ULTRASOUND CORE BIOPSY
1 series · 7 of 7 positions shown · non-contrast
Comparison: none

INDICATION: 33-year-old with a solitary right kidney and history of Rya
nephropathy and proteinuria. Request for renal biopsy. Previous
biopsy in [id number].

[Series 1: ultrasound core biopsy · 7 acquisitions, 7 frames shown]
[im 1/7]
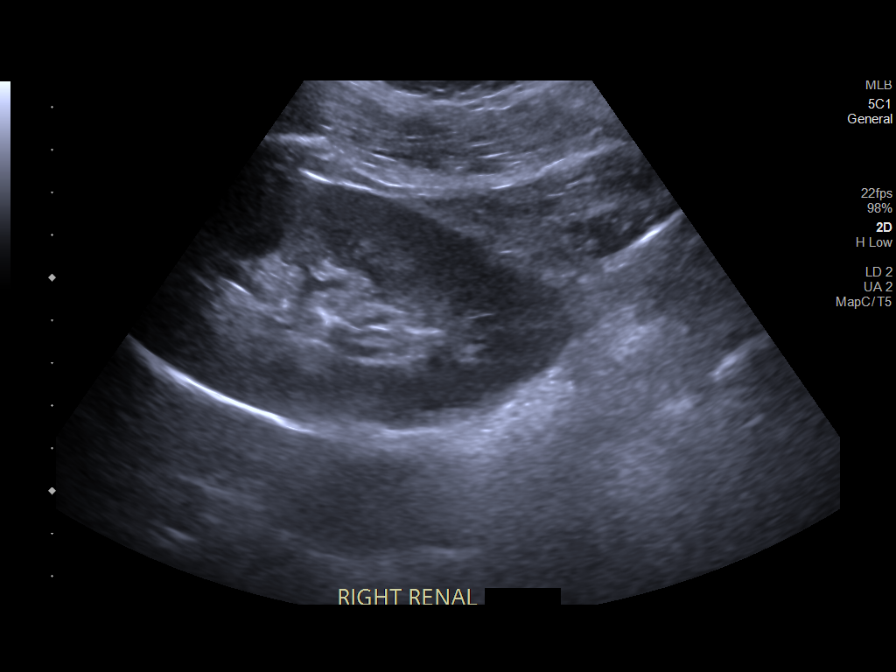
[im 2/7]
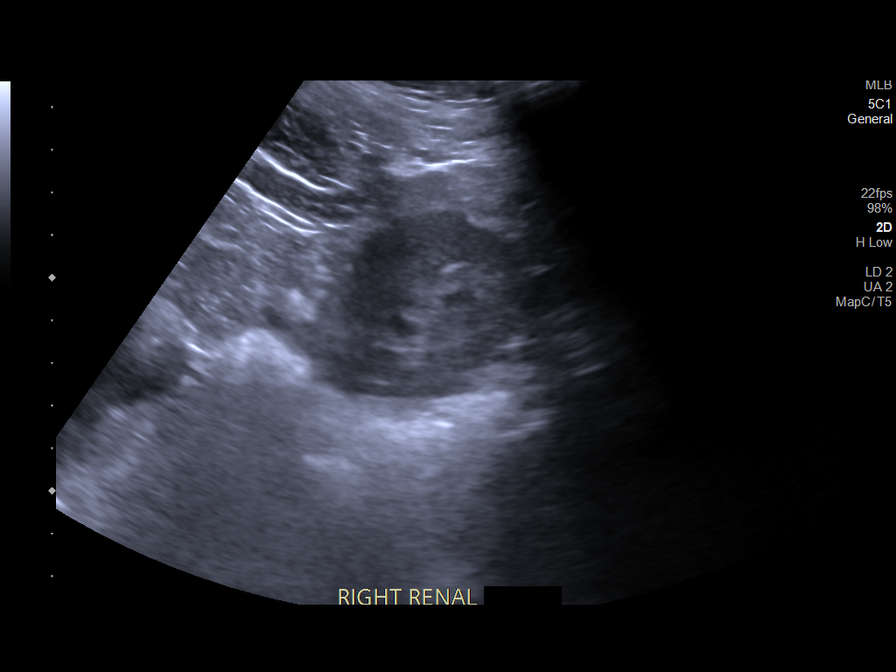
[im 3/7]
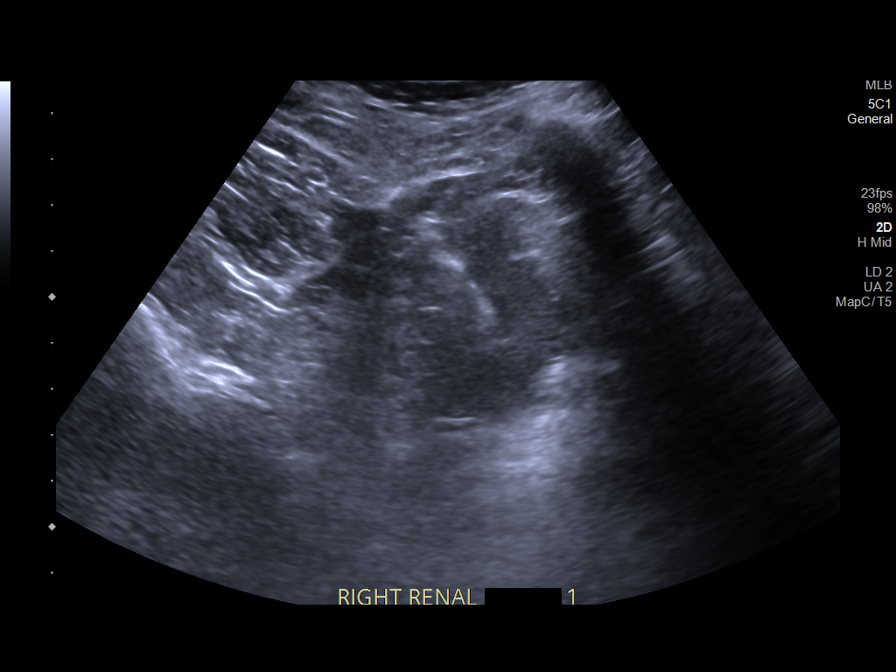
[im 4/7]
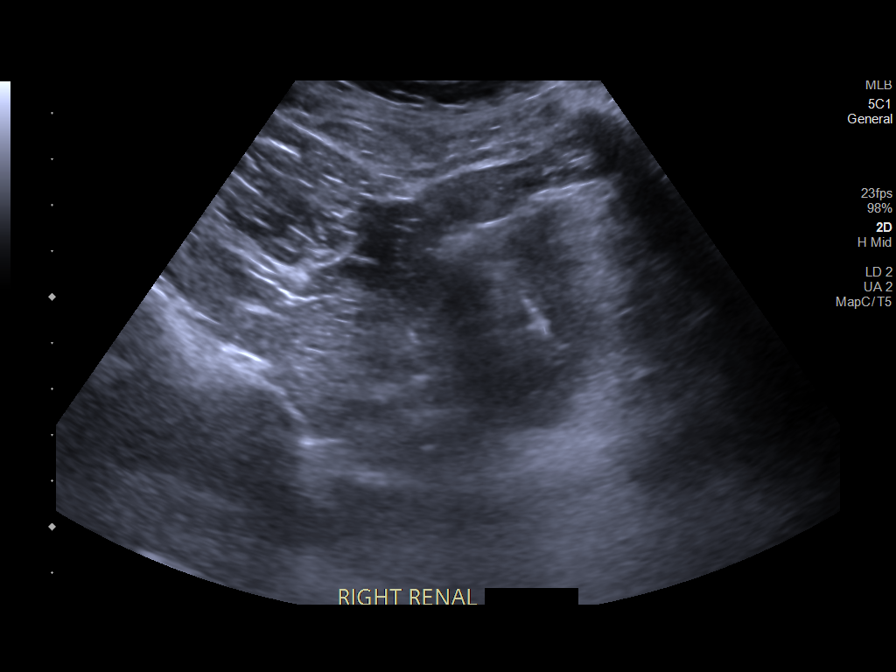
[im 5/7]
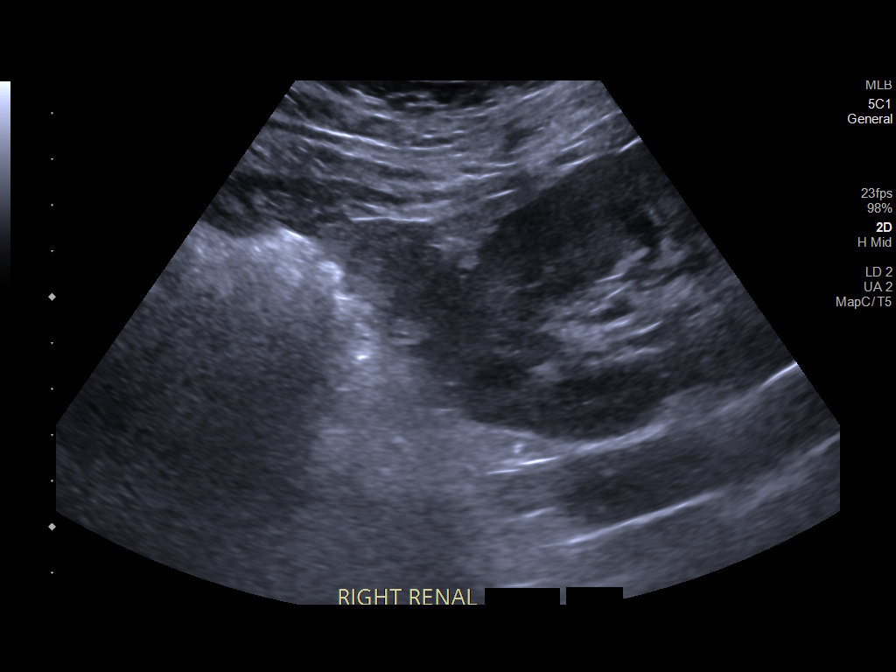
[im 6/7]
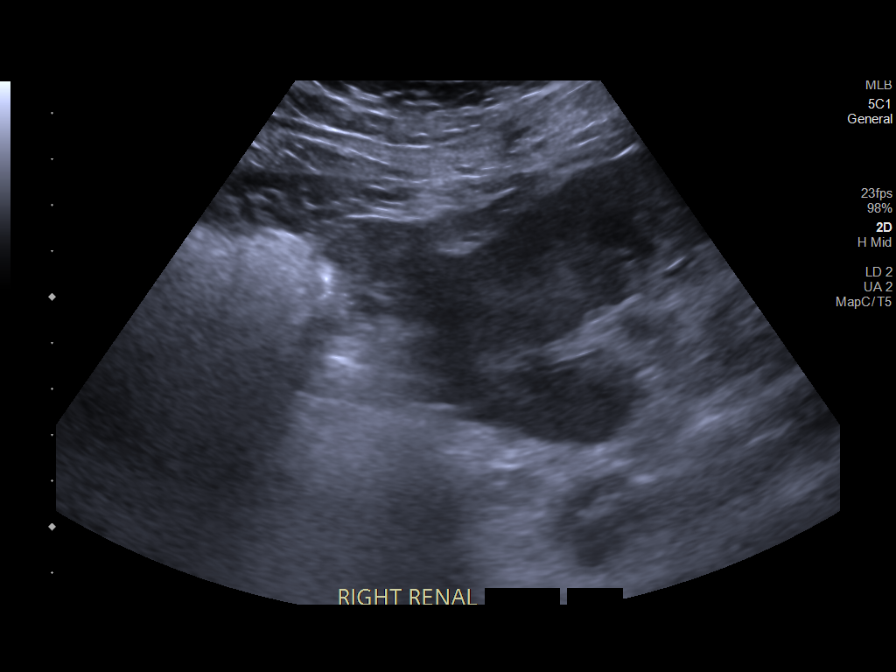
[im 7/7]
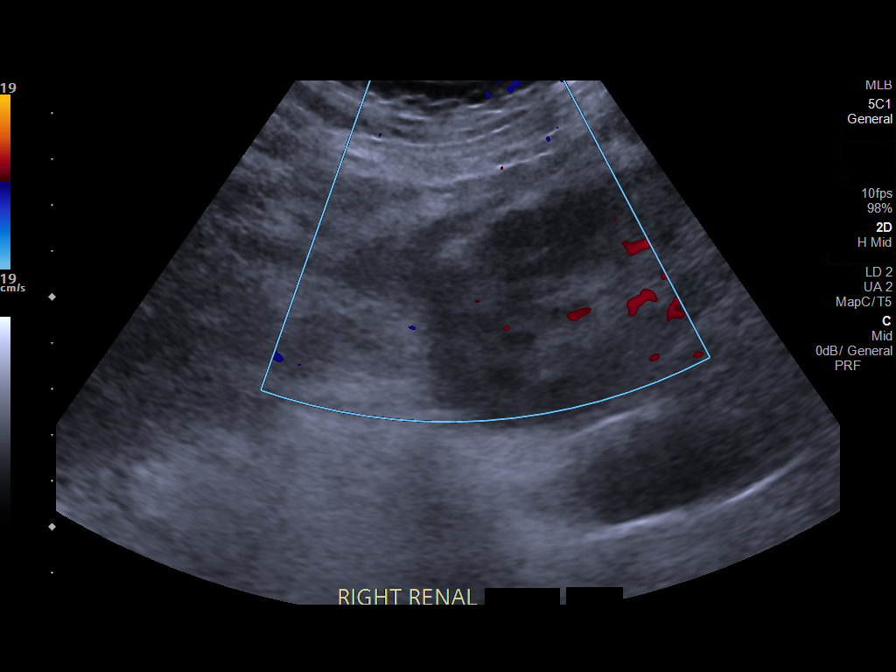

[7 of 7 positions shown; findings below may reference images not displayed]

EXAM:
ULTRASOUND-GUIDED RANDOM RENAL BIOPSY

MEDICATIONS:
None.

ANESTHESIA/SEDATION:
Moderate (conscious) sedation was employed during this procedure. A
total of Versed 2.0 mg and Fentanyl 100 mcg was administered
intravenously.

Moderate Sedation Time: 20 minutes. The patient's level of
consciousness and vital signs were monitored continuously by
radiology nursing throughout the procedure under my direct
supervision.

FLUOROSCOPY TIME:  None

COMPLICATIONS:
None immediate.

PROCEDURE:
Informed written consent was obtained from the patient after a
thorough discussion of the procedural risks, benefits and
alternatives. All questions were addressed. A timeout was performed
prior to the initiation of the procedure.

Patient has a known solitary right kidney. The patient was placed
prone. Right kidney was identified with ultrasound. The right flank
was prepped with chlorhexidine and sterile field was created. Skin
and soft tissues were anesthetized with 1% lidocaine. Using
ultrasound guidance, 16 gauge core needle was directed into the
lower pole. Needle position was confirmed within the cortex. Two
suitable core biopsies were obtained and placed in saline. Follow-up
ultrasound images were obtained. Bandage placed over the puncture
site.
FINDINGS: Negative for right hydronephrosis. Core biopsies obtained from the
right kidney. No evidence for active bleeding or significant
hematoma formation at the end of the procedure.
IMPRESSION: Ultrasound-guided core biopsy of the right kidney lower pole.
